# Patient Record
Sex: Female | Born: 1965 | Race: White | Hispanic: No | Marital: Married | Smoking: Never smoker
Health system: Southern US, Community
[De-identification: ages and names within clinical notes are randomized; demographics above are authoritative.]

## PROBLEM LIST (undated history)

## (undated) HISTORY — PX: ABDOMINAL HYSTERECTOMY: SHX81

---

## 2014-01-06 ENCOUNTER — Emergency Department: Payer: Self-pay | Admitting: Emergency Medicine

## 2014-01-06 LAB — ED INFLUENZA
H1N1 flu by pcr: NOT DETECTED
Influenza A By PCR: NEGATIVE
Influenza B By PCR: NEGATIVE

## 2014-01-07 ENCOUNTER — Emergency Department: Payer: Self-pay | Admitting: Emergency Medicine

## 2014-01-09 LAB — BETA STREP CULTURE(ARMC)

## 2015-02-24 ENCOUNTER — Encounter: Payer: Self-pay | Admitting: Emergency Medicine

## 2015-02-24 ENCOUNTER — Emergency Department: Payer: Self-pay

## 2015-02-24 ENCOUNTER — Emergency Department
Admission: EM | Admit: 2015-02-24 | Discharge: 2015-02-24 | Disposition: A | Discharge status: Home / Self Care | Payer: Self-pay | Attending: Student | Admitting: Student

## 2015-02-24 DIAGNOSIS — R55 Syncope and collapse: Secondary | ICD-10-CM | POA: Insufficient documentation

## 2015-02-24 DIAGNOSIS — J069 Acute upper respiratory infection, unspecified: Secondary | ICD-10-CM | POA: Insufficient documentation

## 2015-02-24 LAB — URINALYSIS COMPLETE WITH MICROSCOPIC (ARMC ONLY)
Bacteria, UA: NONE SEEN
Bilirubin Urine: NEGATIVE
Glucose, UA: NEGATIVE mg/dL
Leukocytes, UA: NEGATIVE
Nitrite: NEGATIVE
Protein, ur: 30 mg/dL — AB
Specific Gravity, Urine: 1.043 — ABNORMAL HIGH (ref 1.005–1.030)
pH: 5 (ref 5.0–8.0)

## 2015-02-24 LAB — CBC
HCT: 41.4 % (ref 35.0–47.0)
Hemoglobin: 13.9 g/dL (ref 12.0–16.0)
MCH: 29.2 pg (ref 26.0–34.0)
MCHC: 33.5 g/dL (ref 32.0–36.0)
MCV: 87.1 fL (ref 80.0–100.0)
Platelets: 170 K/uL (ref 150–440)
RBC: 4.75 MIL/uL (ref 3.80–5.20)
RDW: 14 % (ref 11.5–14.5)
WBC: 4.4 K/uL (ref 3.6–11.0)

## 2015-02-24 LAB — BASIC METABOLIC PANEL
ANION GAP: 10 (ref 5–15)
BUN: 13 mg/dL (ref 6–20)
CALCIUM: 8.8 mg/dL — AB (ref 8.9–10.3)
CO2: 21 mmol/L — ABNORMAL LOW (ref 22–32)
Chloride: 109 mmol/L (ref 101–111)
Creatinine, Ser: 0.72 mg/dL (ref 0.44–1.00)
GFR calc Af Amer: >60 mL/min (ref >60)
GLUCOSE: 139 mg/dL — AB (ref 65–99)
Potassium: 3.8 mmol/L (ref 3.5–5.1)
SODIUM: 140 mmol/L (ref 135–145)

## 2015-02-24 MED ORDER — SODIUM CHLORIDE 0.9 % IV BOLUS (SEPSIS)
1000.0000 mL | Freq: Once | INTRAVENOUS | Status: AC
  Administered 2015-02-24: 1000 mL via INTRAVENOUS

## 2015-02-24 MED ORDER — KETOROLAC TROMETHAMINE 30 MG/ML IJ SOLN
15.0000 mg | Freq: Once | INTRAMUSCULAR | Status: AC
  Administered 2015-02-24: 15 mg via INTRAVENOUS
  Filled 2015-02-24: qty 1

## 2015-02-24 NOTE — ED Notes (Signed)
Pt reports headache x3 days, reports syncopal episode/LOC last night, friend caught her, didn't hit her head.

## 2015-02-24 NOTE — ED Provider Notes (Signed)
[facility] Emergency Department Provider Note  ____________________________________________  Time seen: Approximately 9:55 AM  I have reviewed the triage vital signs and the nursing notes.   HISTORY  Chief Complaint Loss of Consciousness    HPI Dana Patton is a 50 y.o. female with no chronic medical problems who presents for evaluation of 2-3 days cough, sneezing, sore throat, headache, gradual onset, constant since onset, currently mild, no modifying factors. Patient reports that she has been around her friend's small child she was recently sick with upper respiratory tract infection symptoms. Patient also reports that yesterday, she stood up to walk to the bathroom, got lightheaded immediately, felt nauseated and flushed, told her friend that she was going to faint and then fainted. Her friend caught her. She did not hit her head or injure herself.  No chest pain. no shortness of breath. She denies fevers. No neck pain or neck stiffness.   History reviewed. No pertinent past medical history.  There are no active problems to display for this patient.   Past Surgical History  Procedure Laterality Date  . Abdominal hysterectomy      No current outpatient prescriptions on file.  Allergies Vicodin  No family history on file.  Social History Social History  Substance Use Topics  . Smoking status: Never Smoker   . Smokeless tobacco: None  . Alcohol Use: No    Review of Systems Constitutional: No fever/chills Eyes: No visual changes. ENT: + sore throat. Cardiovascular: Denies chest pain. Respiratory: Denies shortness of breath. Gastrointestinal: No abdominal pain.  No nausea, no vomiting.  No diarrhea.  No constipation. Genitourinary: Negative for dysuria. Musculoskeletal: Negative for back pain. Skin: Negative for rash. Neurological: Negative for headaches, focal weakness or numbness.  10-point ROS otherwise  negative.  ____________________________________________   PHYSICAL EXAM:  VITAL SIGNS: ED Triage Vitals  Enc Vitals Group     BP 02/24/15 0950 120/78 mmHg     Pulse Rate 02/24/15 0950 111     Resp 02/24/15 0950 20     Temp 02/24/15 0950 98.2 F (36.8 C)     Temp Source 02/24/15 0950 Oral     SpO2 02/24/15 0950 99 %     Weight 02/24/15 0950 160 lb (72.576 kg)     Height 02/24/15 0950  (1.676 m)     Head Cir --      Peak Flow --      Pain Score 02/24/15 0951 8     Pain Loc --      Pain Edu? --      Excl. in GC? --     Constitutional: Alert and oriented. Well appearing and in no acute distress. Eyes: Conjunctivae are normal. PERRL. EOMI. Head: Atraumatic. Nose: No congestion/rhinnorhea. Mouth/Throat: Mucous membranes are moist.  Oropharynx non-erythematous. Neck: No stridor. No cervical spine tenderness to palpation. Supple without meningismus. Cardiovascular: Normal rate, regular rhythm. Grossly normal heart sounds.  Good peripheral circulation. Respiratory: Normal respiratory effort.  No retractions. Lungs CTAB. Gastrointestinal: Soft and nontender. No distention. No CVA tenderness. Genitourinary: deferred Musculoskeletal: No lower extremity tenderness nor edema.  No joint effusions. Neurologic:  Normal speech and language. No gross focal neurologic deficits are appreciated.  Skin:  Skin is warm, dry and intact. No rash noted. Psychiatric: Mood and affect are normal. Speech and behavior are normal.  ____________________________________________   LABS (all labs ordered are listed, but only abnormal results are displayed)  Labs Reviewed  BASIC METABOLIC PANEL - Abnormal; Notable  for the following:    CO2 21 (*)    Glucose, Bld 139 (*)    Calcium 8.8 (*)    All other components within normal limits  URINALYSIS COMPLETEWITH MICROSCOPIC ([facility] ONLY) - Abnormal; Notable for the following:    Color, Urine AMBER (*)    APPearance CLEAR (*)    Ketones, ur TRACE (*)     Specific Gravity, Urine 1.043 (*)    Hgb urine dipstick 2+ (*)    Protein, ur 30 (*)    Squamous Epithelial / LPF 6-30 (*)    All other components within normal limits  CBC   ____________________________________________  EKG  ED ECG REPORT I, Gayla Doss, the attending physician, personally viewed and interpreted this ECG.   Date: 02/24/2015  EKG Time: 09:52  Rate: 108  Rhythm: sinus tachycardia  Axis: right  Intervals:none  ST&T Change: No acute ST elevation  ____________________________________________  RADIOLOGY  CXR FINDINGS: Lungs are clear. Heart size and pulmonary vascularity are normal. No adenopathy. No bone lesions.  IMPRESSION: No edema or consolidation. ____________________________________________   PROCEDURES  Procedure(s) performed: None  Critical Care performed: No  ____________________________________________   INITIAL IMPRESSION / ASSESSMENT AND PLAN / ED COURSE  Pertinent labs & imaging results that were available during my care of the patient were reviewed by me and considered in my medical decision making (see chart for details).  KHALEA Patton is a 50 y.o. female with no chronic medical problems who presents for evaluation of 2-3 days cough, sneezing, sore throat, headache. On exam, she is very well-appearing and in no acute distress. Talkative, pleasant, laughing. She is afebrile. She was initially tachycardic during her triage vitals however this has resolved at the time of my assessment without any ER intervention. Her exam is benign. She has an intact neurological exam. Suspect viral illness with mild dehydration that may have caused her to have vasovagal syncope yesterday. We'll give IV fluids, obtain screening labs. EKG not consistent with ischemia. Doubt cardiogenic or purely neurogenic cause of syncope.  ----------------------------------------- 1:22 PM on 02/24/2015 ----------------------------------------- Patient is  resting comfortably. Symptoms improved after IV fluids. Chest x-ray clear. CBC and BMP unremarkable. I discussed with her that her urinalysis has several red blood cells, few white blood cells but appears contaminated with multiple squamous cells. She has no dysuria or urinary complaints at this time. We'll send culture. I discussed with her that she needs to follow-up with a primary care doctor in 1 week for reevaluation of microhematuria. We discussed return precautions, need for close follow-up and she is comfortable with the discharge plan. DC home.  ____________________________________________   FINAL CLINICAL IMPRESSION(S) / ED DIAGNOSES  Final diagnoses:  URI (upper respiratory infection)  Syncope, unspecified syncope type      Gayla Doss, MD 02/24/15 1323

## 2015-02-24 NOTE — ED Notes (Signed)
Patient transported to X-ray.  Pt will attempt UA after receiving some fluids. Reports just went

## 2015-03-03 ENCOUNTER — Emergency Department
Admission: EM | Admit: 2015-03-03 | Discharge: 2015-03-03 | Disposition: A | Discharge status: Home / Self Care | Payer: Self-pay | Attending: Emergency Medicine | Admitting: Emergency Medicine

## 2015-03-03 ENCOUNTER — Encounter: Payer: Self-pay | Admitting: Emergency Medicine

## 2015-03-03 DIAGNOSIS — H938X3 Other specified disorders of ear, bilateral: Secondary | ICD-10-CM | POA: Insufficient documentation

## 2015-03-03 DIAGNOSIS — J209 Acute bronchitis, unspecified: Secondary | ICD-10-CM | POA: Insufficient documentation

## 2015-03-03 DIAGNOSIS — R110 Nausea: Secondary | ICD-10-CM | POA: Insufficient documentation

## 2015-03-03 MED ORDER — ALBUTEROL SULFATE HFA 108 (90 BASE) MCG/ACT IN AERS: 1.0000 | INHALATION_SPRAY | Freq: Four times a day (QID) | RESPIRATORY_TRACT | Status: AC | PRN

## 2015-03-03 MED ORDER — BENZONATATE 100 MG PO CAPS: 200.0000 mg | ORAL_CAPSULE | Freq: Three times a day (TID) | ORAL | Status: AC | PRN

## 2015-03-03 MED ORDER — AZITHROMYCIN 250 MG PO TABS: ORAL_TABLET | ORAL | Status: AC

## 2015-03-03 NOTE — ED Provider Notes (Signed)
[facility] Emergency Department Provider Note  ____________________________________________  Time seen: Approximately 2:36 PM  I have reviewed the triage vital signs and the nursing notes.   HISTORY  Chief Complaint Cough and Headache    HPI Dana Patton is a 50 y.o. female , NAD, presents to the emergency department with 10 day history of dry cough. Was seen in this emergency department approximately one week ago with similar symptoms, diagnosed with viral upper respiratory infection and asked to follow up with primary care provider. Unfortunately she does not have insurance and cannot afford to seek care outside of the emergency department. She presents here again today as cough is not improved. States the cough is dry and constant. Can be productive of sputum at times. She cough so much that it causes her to feel dizzy and nauseous. Denies any fever, chills, body aches.   History reviewed. No pertinent past medical history.  There are no active problems to display for this patient.   Past Surgical History  Procedure Laterality Date  . Abdominal hysterectomy      Current Outpatient Rx  Name  Route  Sig  Dispense  Refill  . albuterol (PROVENTIL HFA;VENTOLIN HFA) 108 (90 Base) MCG/ACT inhaler   Inhalation   Inhale 1-2 puffs into the lungs every 6 (six) hours as needed for wheezing or shortness of breath.   1 Inhaler   0   . azithromycin (ZITHROMAX Z-PAK) 250 MG tablet      Take 2 tablets (500 mg) on  Day 1,  followed by 1 tablet (250 mg) once daily on Days 2 through 5.   6 each   0   . benzonatate (TESSALON PERLES) 100 MG capsule   Oral   Take 2 capsules (200 mg total) by mouth 3 (three) times daily as needed for cough.   30 capsule   0     Allergies Vicodin  No family history on file.  Social History Social History  Substance Use Topics  . Smoking status: Never Smoker   . Smokeless tobacco: None  . Alcohol Use: No      Review of Systems Constitutional: No fever/chills Eyes: No visual changes. No discharge ENT: No sore throat, ear pain, sinus pain. Cardiovascular: No chest pain. Respiratory: Positive cough and chest congestion. Positive shortness of breath with coughing fits. No wheezing.  Gastrointestinal: Additional nausea with coughing fits. No abdominal pain, vomiting.   Musculoskeletal: Negative for allergies.  Skin: Negative for rash. Neurological: Positive for headaches with cough, but no focal weakness or numbness. 10-point ROS otherwise negative.  ____________________________________________   PHYSICAL EXAM:  VITAL SIGNS: ED Triage Vitals  Enc Vitals Group     BP 03/03/15 1336 101/85 mmHg     Pulse Rate 03/03/15 1336 92     Resp 03/03/15 1336 20     Temp 03/03/15 1336 98 F (36.7 C)     Temp Source 03/03/15 1336 Oral     SpO2 03/03/15 1336 97 %     Weight 03/03/15 1336 160 lb (72.576 kg)     Height 03/03/15 1336  (1.676 m)     Head Cir --      Peak Flow --      Pain Score 03/03/15 1346 8     Pain Loc --      Pain Edu? --      Excl. in GC? --     Constitutional: Alert and oriented. Well appearing and in no acute  distress. Eyes: Conjunctivae are normal. PERRL. EOMI without pain.  Head: Atraumatic. ENT:      Ears: Lateral TMs visualized without perforation, erythema, bulging with normal light reflex. Mild serous effusion bilaterally.      Nose: No congestion/rhinnorhea.      Mouth/Throat: Mucous membranes are moist. Pharynx without erythema, swelling, exudate Neck: No stridor. Supple with full range of motion Hematological/Lymphatic/Immunilogical: No cervical lymphadenopathy. Cardiovascular: Normal rate, regular rhythm. Normal S1 and S2.   Respiratory: Normal respiratory effort without tachypnea or retractions. Lungs CTAB. Coughing through visit. Neurologic:  Normal speech and language. No gross focal neurologic deficits are appreciated.  Skin:  Skin is warm, dry  and intact. No rash noted. Psychiatric: Mood and affect are normal. Speech and behavior are normal. Patient exhibits appropriate insight and judgement.   ____________________________________________   LABS  None  ____________________________________________  EKG  None ____________________________________________  RADIOLOGY  None ____________________________________________    PROCEDURES  Procedure(s) performed: None   Medications - No data to display   ____________________________________________   INITIAL IMPRESSION / ASSESSMENT AND PLAN / ED COURSE  Patient's diagnosis is consistent with acute bronchitis. Patient will be discharged home with prescriptions for Zithromax to take as directed, Tessalon Perles to decrease cyclical cough, albuterol inhaler to use 1-2 puffs as needed every 6 hours for shortness of breath. Patient is to follow up with [facility] if symptoms persist past this treatment course. Patient is given ED precautions to return to the ED for any worsening or new symptoms.    ____________________________________________  FINAL CLINICAL IMPRESSION(S) / ED DIAGNOSES  Final diagnoses:  Acute bronchitis, unspecified organism      NEW MEDICATIONS STARTED DURING THIS VISIT:  New Prescriptions   ALBUTEROL (PROVENTIL HFA;VENTOLIN HFA) 108 (90 BASE) MCG/ACT INHALER    Inhale 1-2 puffs into the lungs every 6 (six) hours as needed for wheezing or shortness of breath.   AZITHROMYCIN (ZITHROMAX Z-PAK) 250 MG TABLET    Take 2 tablets (500 mg) on  Day 1,  followed by 1 tablet (250 mg) once daily on Days 2 through 5.   BENZONATATE (TESSALON PERLES) 100 MG CAPSULE    Take 2 capsules (200 mg total) by mouth 3 (three) times daily as needed for cough.         Hope Pigeon, PA-C 03/03/15 1448  Phineas Semen, MD 03/03/15 [phone]

## 2015-03-03 NOTE — Discharge Instructions (Signed)

## 2015-03-03 NOTE — ED Notes (Signed)
Pt here for cough and headache, was seen here last Thursday for the same. States she needs something for her cough.

## 2015-03-03 NOTE — ED Notes (Signed)
States she was seen for cough last week   conts to have cough but now has some headache  Dry cough

## 2015-03-05 ENCOUNTER — Encounter: Payer: Self-pay | Admitting: Emergency Medicine

## 2015-03-05 ENCOUNTER — Emergency Department
Admission: EM | Admit: 2015-03-05 | Discharge: 2015-03-05 | Disposition: A | Discharge status: Home / Self Care | Payer: Self-pay | Attending: Emergency Medicine | Admitting: Emergency Medicine

## 2015-03-05 ENCOUNTER — Emergency Department: Payer: Self-pay

## 2015-03-05 DIAGNOSIS — Z792 Long term (current) use of antibiotics: Secondary | ICD-10-CM | POA: Insufficient documentation

## 2015-03-05 DIAGNOSIS — J40 Bronchitis, not specified as acute or chronic: Secondary | ICD-10-CM | POA: Insufficient documentation

## 2015-03-05 LAB — COMPREHENSIVE METABOLIC PANEL
ALBUMIN: 3.9 g/dL (ref 3.5–5.0)
ALT: 58 U/L — ABNORMAL HIGH (ref 14–54)
ANION GAP: 4 — AB (ref 5–15)
AST: 40 U/L (ref 15–41)
Alkaline Phosphatase: 142 U/L — ABNORMAL HIGH (ref 38–126)
BILIRUBIN TOTAL: 0.4 mg/dL (ref 0.3–1.2)
BUN: 15 mg/dL (ref 6–20)
CHLORIDE: 106 mmol/L (ref 101–111)
CO2: 29 mmol/L (ref 22–32)
Calcium: 9.1 mg/dL (ref 8.9–10.3)
Creatinine, Ser: 0.57 mg/dL (ref 0.44–1.00)
GFR calc Af Amer: >60 mL/min (ref >60)
Glucose, Bld: 98 mg/dL (ref 65–99)
POTASSIUM: 4.1 mmol/L (ref 3.5–5.1)
Sodium: 139 mmol/L (ref 135–145)
TOTAL PROTEIN: 7.6 g/dL (ref 6.5–8.1)

## 2015-03-05 LAB — CBC WITH DIFFERENTIAL/PLATELET
BASOS ABS: 0.1 10*3/uL (ref 0–0.1)
BASOS PCT: 1 %
Eosinophils Absolute: 0.2 10*3/uL (ref 0–0.7)
Eosinophils Relative: 2 %
HEMATOCRIT: 42 % (ref 35.0–47.0)
Hemoglobin: 14.3 g/dL (ref 12.0–16.0)
LYMPHS ABS: 2.6 10*3/uL (ref 1.0–3.6)
LYMPHS PCT: 24 %
MCH: 29.4 pg (ref 26.0–34.0)
MCHC: 34 g/dL (ref 32.0–36.0)
MCV: 86.4 fL (ref 80.0–100.0)
MONO ABS: 0.7 10*3/uL (ref 0.2–0.9)
Monocytes Relative: 7 %
NEUTROS ABS: 7.1 10*3/uL — AB (ref 1.4–6.5)
Neutrophils Relative %: 66 %
PLATELETS: 320 10*3/uL (ref 150–440)
RBC: 4.86 MIL/uL (ref 3.80–5.20)
RDW: 13.8 % (ref 11.5–14.5)
WBC: 10.7 10*3/uL (ref 3.6–11.0)

## 2015-03-05 MED ORDER — ALBUTEROL SULFATE (2.5 MG/3ML) 0.083% IN NEBU
2.5000 mg | INHALATION_SOLUTION | Freq: Once | RESPIRATORY_TRACT | Status: AC
  Administered 2015-03-05: 2.5 mg via RESPIRATORY_TRACT
  Filled 2015-03-05: qty 3

## 2015-03-05 NOTE — ED Notes (Signed)
Pt c/o dry hacking cough for the last two weeks - Non-productive per pt - Also c/o headache

## 2015-03-05 NOTE — Discharge Instructions (Signed)
Please call the [facility] to establish care.   May also consider [facility] or the [facility].

## 2015-03-05 NOTE — ED Provider Notes (Signed)
[facility] Emergency Department Provider Note  ____________________________________________  Time seen: Approximately 3:22 PM  I have reviewed the triage vital signs and the nursing notes.   HISTORY  Chief Complaint Cough    HPI AUGUSTINE BRANNICK is a 50 y.o. female , NAD, presents to the emergency department with continued dry hacking cough for the last 2 weeks. Patient was seen 2 days ago and given a Z-Pak, benzonatate tablets and an albuterol inhaler. Notes she continues with a tickle in her throat and cyclical coughing. Has not improved. Continues with headache with cough. Denies LOC, dizziness, weakness, numbness, tingling. Denies chest congestion and wheezing. No chest pain. Continues to deny fever, chills, body aches.   History reviewed. No pertinent past medical history.  There are no active problems to display for this patient.   Past Surgical History  Procedure Laterality Date  . Abdominal hysterectomy      Current Outpatient Rx  Name  Route  Sig  Dispense  Refill  . albuterol (PROVENTIL HFA;VENTOLIN HFA) 108 (90 Base) MCG/ACT inhaler   Inhalation   Inhale 1-2 puffs into the lungs every 6 (six) hours as needed for wheezing or shortness of breath.   1 Inhaler   0   . azithromycin (ZITHROMAX Z-PAK) 250 MG tablet      Take 2 tablets (500 mg) on  Day 1,  followed by 1 tablet (250 mg) once daily on Days 2 through 5.   6 each   0   . benzonatate (TESSALON PERLES) 100 MG capsule   Oral   Take 2 capsules (200 mg total) by mouth 3 (three) times daily as needed for cough.   30 capsule   0     Allergies Vicodin  No family history on file.  Social History Social History  Substance Use Topics  . Smoking status: Never Smoker   . Smokeless tobacco: None  . Alcohol Use: Yes     Review of Systems Constitutional: No fever/chills Eyes: No visual changes. No discharge ENT: Positive tickle in the throat. No sore throat, ear pain, nasal  congestion. Cardiovascular: No chest pain. Respiratory: Positive dry, nonproductive cough. No shortness of breath. No wheezing.  Gastrointestinal: No abdominal pain.  No nausea, vomiting.  No diarrhea, constipation. Musculoskeletal: Negative for general myalgias.  Skin: Negative for rash. Neurological: Positive for headaches as cough only, but no focal weakness or numbness. 10-point ROS otherwise negative.  ____________________________________________   PHYSICAL EXAM:  VITAL SIGNS: ED Triage Vitals  Enc Vitals Group     BP 03/05/15 1403 120/81 mmHg     Pulse Rate 03/05/15 1403 97     Resp 03/05/15 1403 20     Temp 03/05/15 1403 99 F (37.2 C)     Temp Source 03/05/15 1403 Oral     SpO2 03/05/15 1403 100 %     Weight 03/05/15 1403 160 lb (72.576 kg)     Height 03/05/15 1403  (1.676 m)     Head Cir --      Peak Flow --      Pain Score 03/05/15 1404 10     Pain Loc --      Pain Edu? --      Excl. in GC? --     Constitutional: Alert and oriented. Well appearing and in no acute distress. Eyes: Conjunctivae are normal. PERRL. EOMI without pain.  Head: Atraumatic. Neck: Supple with full range of motion. No stridor. Cardiovascular: Normal rate, regular rhythm. Normal  S1 and S2.  Good peripheral circulation. Respiratory: Increased respiratory rate visually but no increased respiratory effort nor retractions. Lungs CTAB. Neurologic:  Normal speech and language. No gross focal neurologic deficits are appreciated.  Skin:  Skin is warm, dry and intact. No rash noted. Psychiatric: Mood and affect are normal. Speech and behavior are normal. Patient exhibits appropriate insight and judgement.   ____________________________________________   LABS (all labs ordered are listed, but only abnormal results are displayed)  Labs Reviewed  COMPREHENSIVE METABOLIC PANEL - Abnormal; Notable for the following:    ALT 58 (*)    Alkaline Phosphatase 142 (*)    Anion gap 4 (*)    All  other components within normal limits  CBC WITH DIFFERENTIAL/PLATELET - Abnormal; Notable for the following:    Neutro Abs 7.1 (*)    All other components within normal limits   ____________________________________________  EKG  None ____________________________________________  RADIOLOGY I have personally viewed and evaluated these images (plain radiographs) as part of my medical decision making, as well as reviewing the written report by the radiologist.  Dg Chest 2 View  03/05/2015  CLINICAL DATA:  Cough for 2 weeks.  Subsequent encounter. EXAM: CHEST  2 VIEW COMPARISON:  PA and lateral chest 02/24/2015. FINDINGS: The lungs are clear. Heart size is normal. No pneumothorax or pleural effusion. No focal bony abnormality. IMPRESSION: Negative chest. Electronically Signed   By: Drusilla Kanner M.D.   On: 03/05/2015 15:52    ____________________________________________    PROCEDURES  Procedure(s) performed: None    Medications  albuterol (PROVENTIL) (2.5 MG/3ML) 0.083% nebulizer solution 2.5 mg (2.5 mg Nebulization Given 03/05/15 1617)     ____________________________________________   INITIAL IMPRESSION / ASSESSMENT AND PLAN / ED COURSE  Prior to administration of albuterol nebulizer solution, I entered the exam room to discuss such with the patient. She was playing in the exam bed l asked and playing on her cell phone. Respiratory rate was normal without any increased effort or visualize shortness of breath. When the patient made eye contact with me, her respiratory rate increased. Discussed administration of albuterol nebulized solution and patient agreed. After albuterol was administered, the patient notes she had no more cough in improved respiration. Patient again was monitored by myself and noted to have no abnormal respiratory rate without increased effort of breath and a normal respiratory exam.   Pertinent labs & imaging results that were available during my care of  the patient were reviewed by me and considered in my medical decision making (see chart for details).  Patient's diagnosis is consistent with acute bronchitis. As x-ray was negative and CBC and CMP without any significant abnormalities. Patient will be discharged home with instructions to continue previous medications as prescribed. Patient is to follow up with the [facility] community clinic if symptoms persist past this treatment course. Patient is given ED precautions to return to the ED for any worsening or new symptoms.   ____________________________________________  FINAL CLINICAL IMPRESSION(S) / ED DIAGNOSES  Final diagnoses:  Bronchitis      NEW MEDICATIONS STARTED DURING THIS VISIT:  New Prescriptions   No medications on file         Hope Pigeon, PA-C 03/05/15 1651  Emily Filbert, MD 03/09/15 1040

## 2015-03-05 NOTE — ED Notes (Signed)
Cough x 2 weeks, continual dry cough.

## 2015-12-15 DIAGNOSIS — Y999 Unspecified external cause status: Secondary | ICD-10-CM | POA: Insufficient documentation

## 2015-12-15 DIAGNOSIS — Y939 Activity, unspecified: Secondary | ICD-10-CM | POA: Insufficient documentation

## 2015-12-15 DIAGNOSIS — S60051A Contusion of right little finger without damage to nail, initial encounter: Secondary | ICD-10-CM | POA: Insufficient documentation

## 2015-12-15 DIAGNOSIS — W231XXA Caught, crushed, jammed, or pinched between stationary objects, initial encounter: Secondary | ICD-10-CM | POA: Insufficient documentation

## 2015-12-15 DIAGNOSIS — Y929 Unspecified place or not applicable: Secondary | ICD-10-CM | POA: Insufficient documentation

## 2015-12-15 DIAGNOSIS — Z79899 Other long term (current) drug therapy: Secondary | ICD-10-CM | POA: Insufficient documentation

## 2015-12-16 ENCOUNTER — Emergency Department (HOSPITAL_COMMUNITY)
Admission: EM | Admit: 2015-12-16 | Discharge: 2015-12-16 | Disposition: A | Discharge status: Home / Self Care | Payer: Self-pay | Attending: Emergency Medicine | Admitting: Emergency Medicine

## 2015-12-16 ENCOUNTER — Emergency Department (HOSPITAL_COMMUNITY): Payer: Self-pay

## 2015-12-16 ENCOUNTER — Encounter (HOSPITAL_COMMUNITY): Payer: Self-pay | Admitting: Emergency Medicine

## 2015-12-16 DIAGNOSIS — S6000XA Contusion of unspecified finger without damage to nail, initial encounter: Secondary | ICD-10-CM

## 2015-12-16 NOTE — Discharge Instructions (Signed)
Follow-up with your primary Dr. if not improving in the next week.

## 2015-12-16 NOTE — ED Triage Notes (Signed)
Pt states she crushed pinky finger between dresser and wall while moving furniture 2 days ago pain has worsen

## 2015-12-16 NOTE — ED Notes (Signed)
Pt refused last set of vitals.  Unable to get e-pad to work.  Verbally consented to discharge.

## 2015-12-16 NOTE — ED Provider Notes (Signed)
[facility]-EMERGENCY DEPT Provider Note   CSN: [id number] Arrival date & time: 12/15/15  2341  By signing my name below, I, Dana Patton, attest that this documentation has been prepared under the direction and in the presence of Dana Patton Jesusa Stenerson, MD. Electronically Signed: Soijett Patton, ED Scribe. 12/16/15. 1:41 AM.  History   Chief Complaint Chief Complaint  Patient presents with  . Finger Injury    HPI Dana Patton is a 50 y.o. female who presents to the Emergency Department complaining of right pinky finger injury occurring 2 days ago. Pt notes that she was attempting to move furniture when her right pinky finger was crushed between a dresser and the wall. Pt denies being evaluated following the onset of her symptoms. Pt states that her right pinky finger pain has increased since the initial injury. Pt is having associated symptoms of right pinky finger pain and right pinky finger swelling. She notes that she has not tried any medications for the relief of her symptoms. She denies color change, wound, and any other symptoms.    The history is provided by the patient. No language interpreter was used.    History reviewed. No pertinent past medical history.  There are no active problems to display for this patient.   Past Surgical History:  Procedure Laterality Date  . ABDOMINAL HYSTERECTOMY      OB History    No data available       Home Medications    Prior to Admission medications   Medication Sig Start Date End Date Taking? Authorizing Provider  albuterol (PROVENTIL HFA;VENTOLIN HFA) 108 (90 Base) MCG/ACT inhaler Inhale 1-2 puffs into the lungs every 6 (six) hours as needed for wheezing or shortness of breath. 03/03/15   Jami L Hagler, PA-C  azithromycin (ZITHROMAX Z-PAK) 250 MG tablet Take 2 tablets (500 mg) on  Day 1,  followed by 1 tablet (250 mg) once daily on Days 2 through 5. 03/03/15   Jami L Hagler, PA-C  benzonatate (TESSALON PERLES) 100 MG capsule Take 2 capsules  (200 mg total) by mouth 3 (three) times daily as needed for cough. 03/03/15   Jami L Hagler, PA-C    Family History History reviewed. No pertinent family history.  Social History Social History  Substance Use Topics  . Smoking status: Never Smoker  . Smokeless tobacco: Never Used  . Alcohol use Yes     Allergies   Vicodin [hydrocodone-acetaminophen]   Review of Systems Review of Systems  All other systems reviewed and are negative.    Physical Exam Updated Vital Signs BP 145/85 (BP Location: Left Arm)   Pulse 100   Temp 97.9 F (36.6 C) (Oral)   Resp 15   Ht 5\' 6"  (1.676 m)   Wt 140 lb (63.5 kg)   SpO2 99%   BMI 22.60 kg/m   Physical Exam  Constitutional: She is oriented to person, place, and time. She appears well-developed and well-nourished. No distress.  HENT:  Head: Normocephalic and atraumatic.  Eyes: EOM are normal.  Neck: Neck supple.  Cardiovascular: Normal rate.   Pulmonary/Chest: Effort normal. No respiratory distress.  Abdominal: She exhibits no distension.  Musculoskeletal: Normal range of motion.       Right hand: She exhibits tenderness and swelling.  Swelling and TTP over the DIP joint of right fifth finger. Mild pain with ROM.   Neurological: She is alert and oriented to person, place, and time.  Skin: Skin is warm and dry.  Psychiatric: She has  a normal mood and affect. Her behavior is normal.  Nursing note and vitals reviewed.    ED Treatments / Results  DIAGNOSTIC STUDIES: Oxygen Saturation is 99% on RA, nl by my interpretation.    COORDINATION OF CARE: 1:39 AM Discussed treatment plan with pt at bedside which includes right little finger xray and pt agreed to plan.   Radiology Dg Finger Little Right  Result Date: 12/16/2015 CLINICAL DATA:  Crushed right fifth finger, with pain and swelling. Initial encounter. EXAM: RIGHT LITTLE FINGER 2+V COMPARISON:  None. FINDINGS: There is no evidence of acute fracture or dislocation.  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 Mild surrounding soft tissue swelling is noted. IMPRESSION: 1. No evidence of acute fracture or dislocation. 2. 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 Electronically Signed   By: Roanna RaiderJeffery  Chang M.D.   On: 12/16/2015 02:00    Procedures Procedures (including critical care time)  Medications Ordered in ED Medications - No data to display   Initial Impression / Assessment and Plan / ED Course  I have reviewed the triage vital signs and the nursing notes.  Pertinent imaging results that were available during my care of the patient were reviewed by me and considered in my medical decision making (see chart for details).  Clinical Course     X-rays negative for fracture. Will treat as a contusion. To return as needed for any problems.  Final Clinical Impressions(s) / ED Diagnoses   Final diagnoses:  None    New Prescriptions New Prescriptions   No medications on file   I personally performed the services described in this documentation, which was scribed in my presence. The recorded information has been reviewed and is accurate.        Dana Patton Dana Liese, MD 12/16/15 [phone]

## 2017-01-09 IMAGING — CR DG CHEST 2V
1 series · 2 of 2 positions shown · non-contrast
Comparison: None.

CLINICAL DATA: Cough for 3 days.  Shortness of breath for 2 days

EXAM:
CHEST  2 VIEW

[Series 1: dg chest 2 view · 0.14mm/px · 2 of 2 slices shown]
[im 1/2]
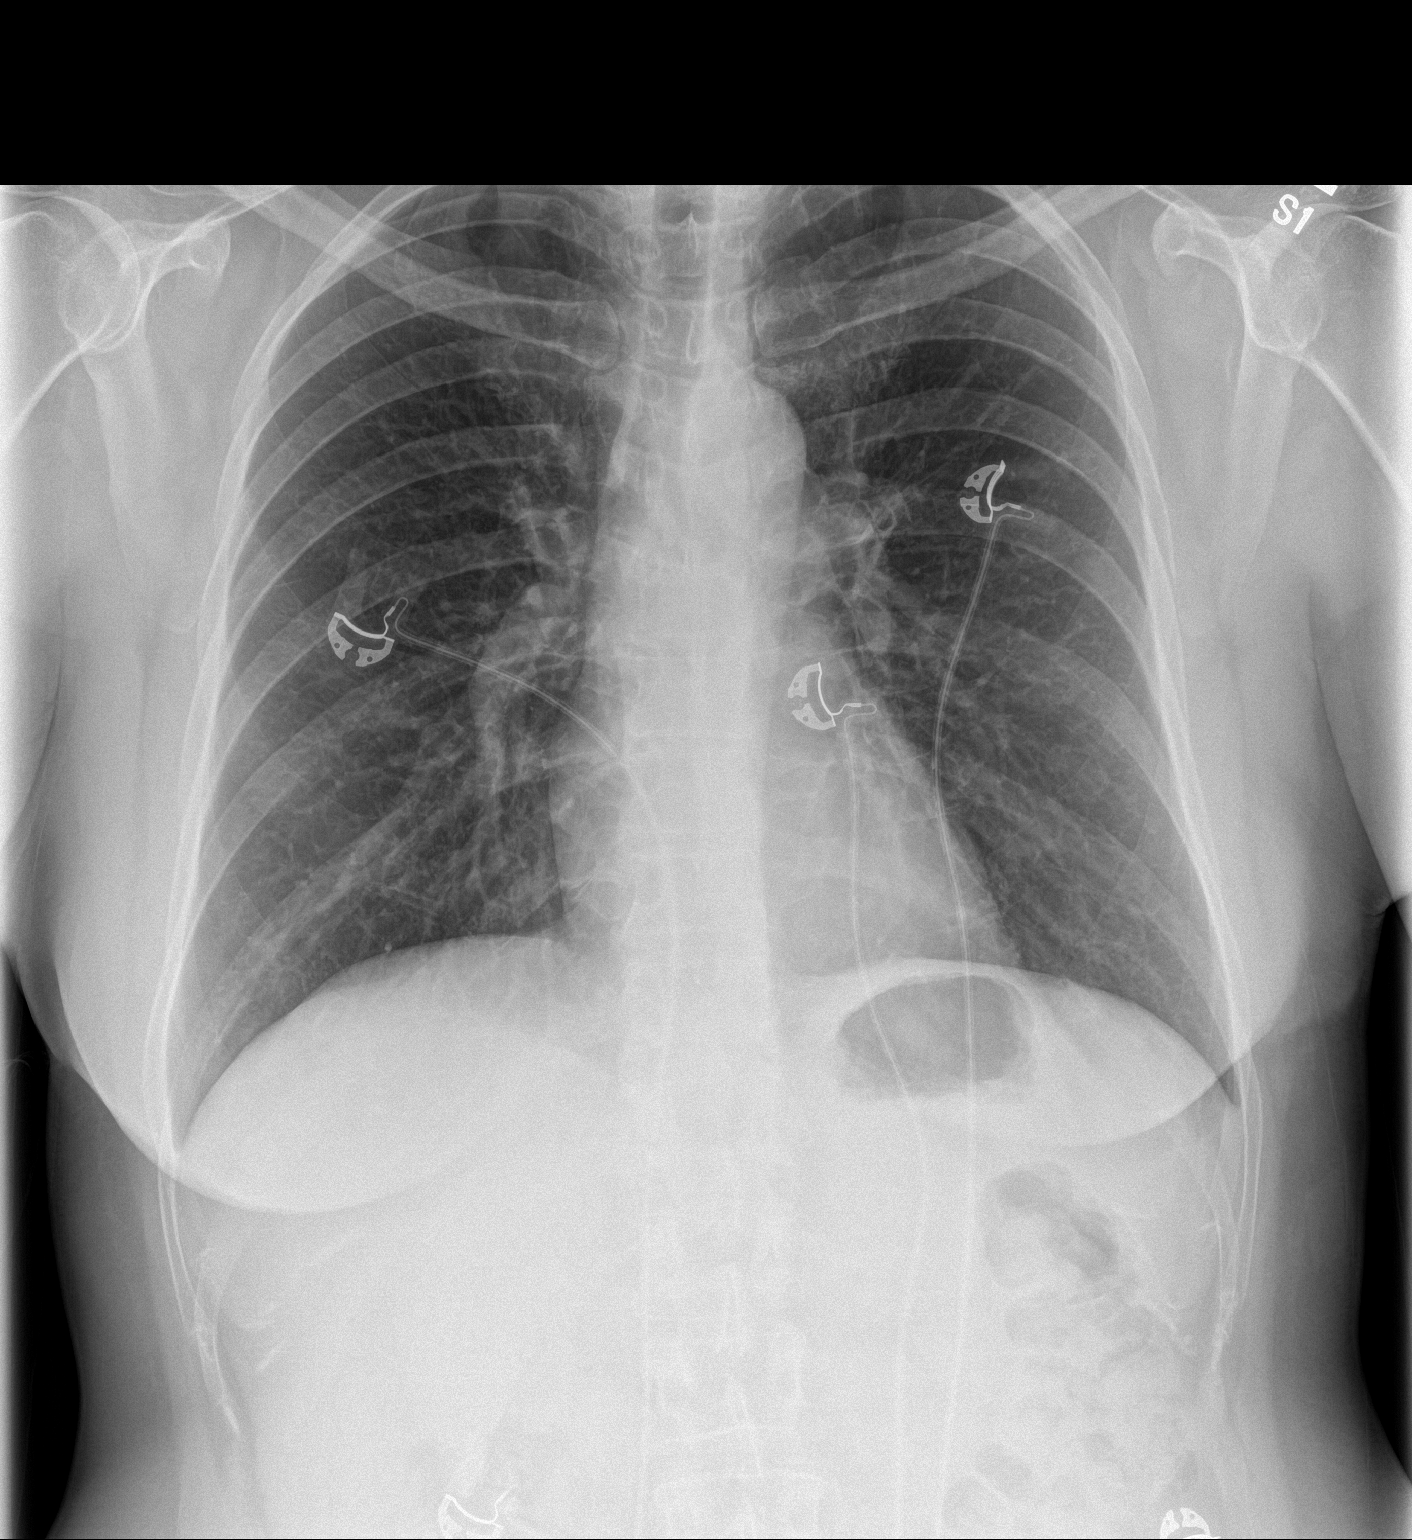
[im 2/2]
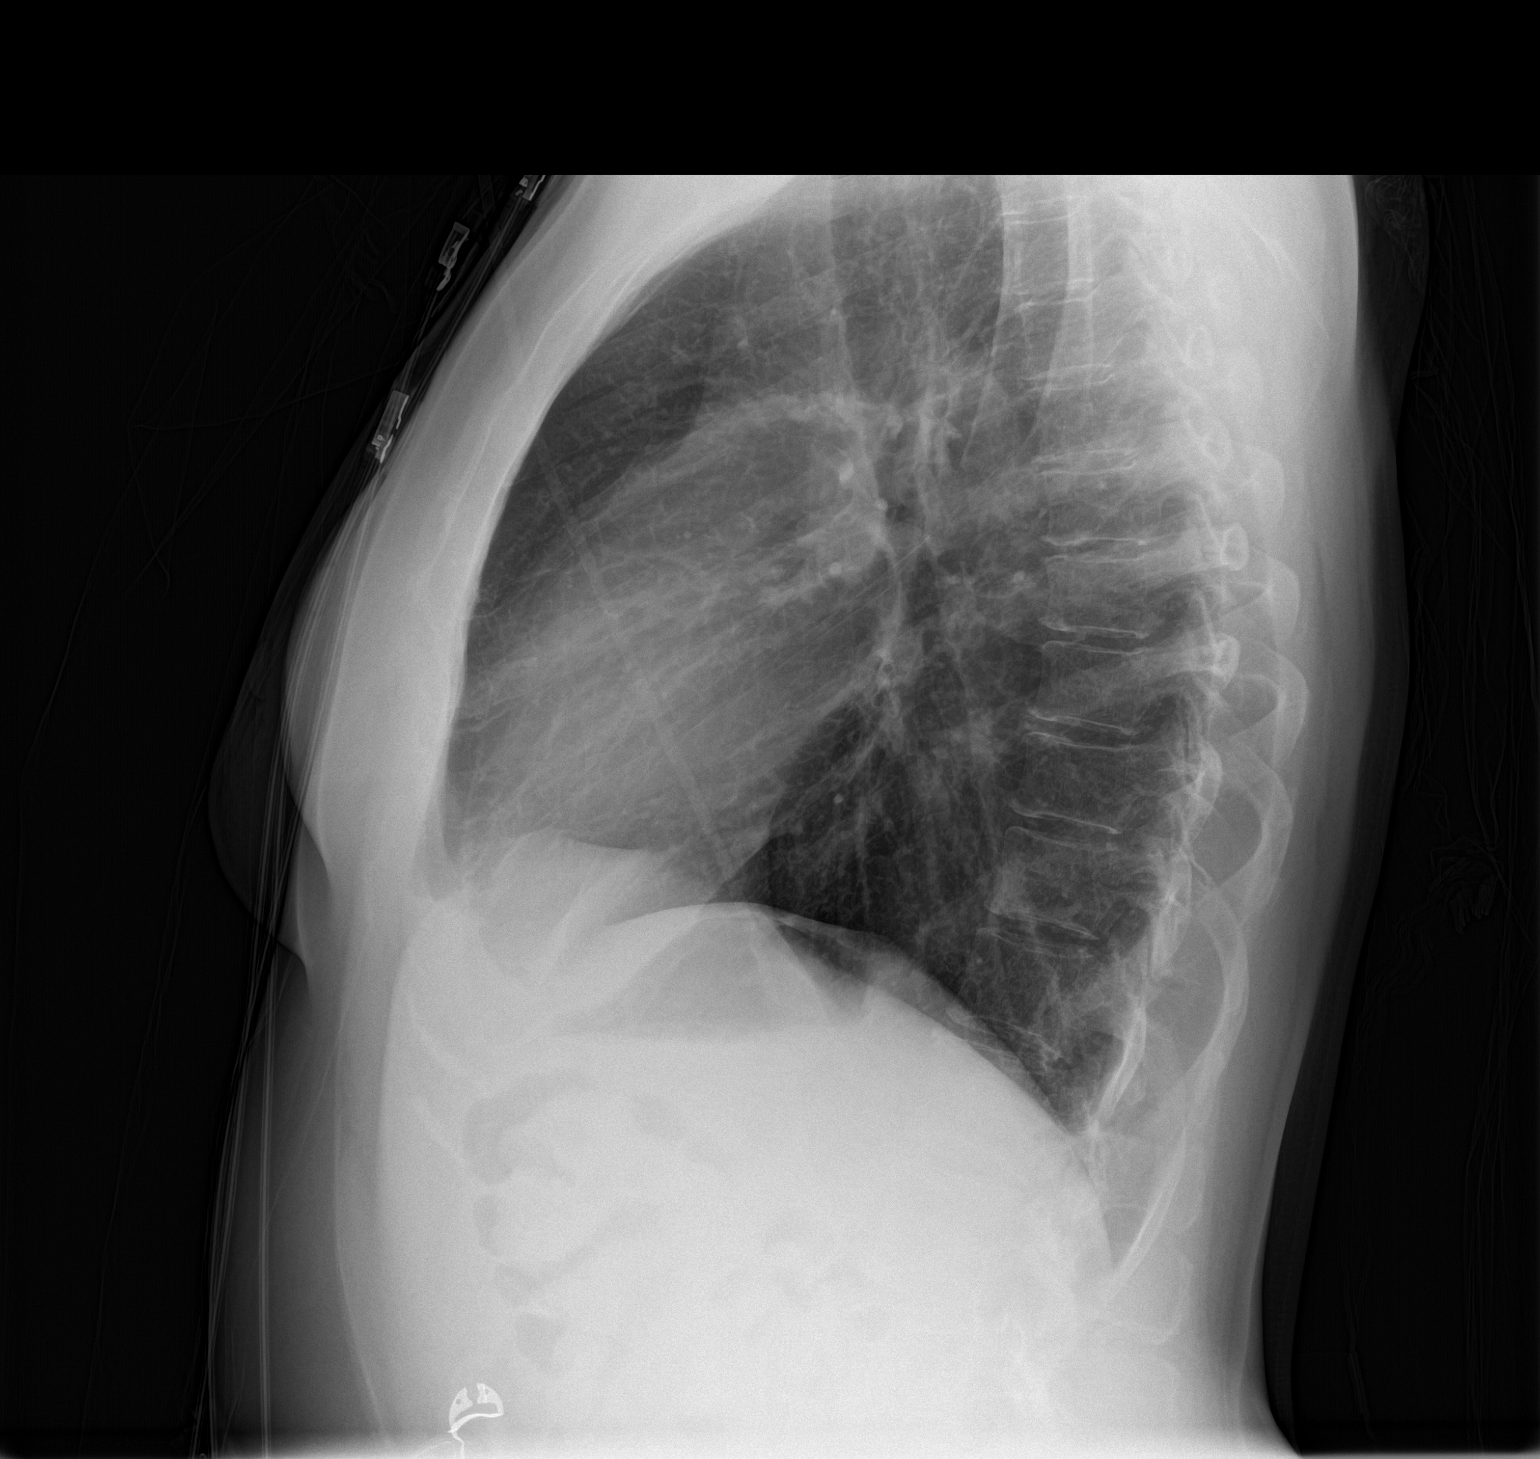

[2 of 2 positions shown; findings below may reference images not displayed]

FINDINGS: Lungs are clear. Heart size and pulmonary vascularity are normal. No
adenopathy. No bone lesions.
IMPRESSION: No edema or consolidation.

## 2017-10-31 IMAGING — CR DG FINGER LITTLE 2+V*R*
3 series · 3 of 3 positions shown · non-contrast
Comparison: None.

CLINICAL DATA: Crushed right fifth finger, with pain and swelling.
Initial encounter.

EXAM:
RIGHT LITTLE FINGER 2+V

[x finger pa right]
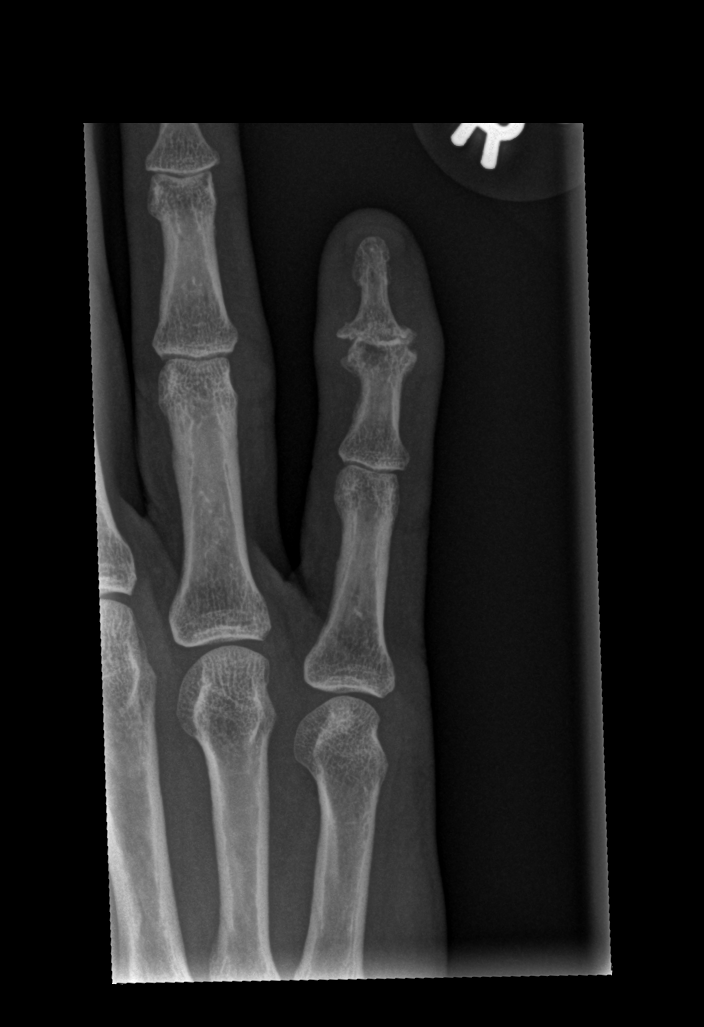

[x finger obl right]
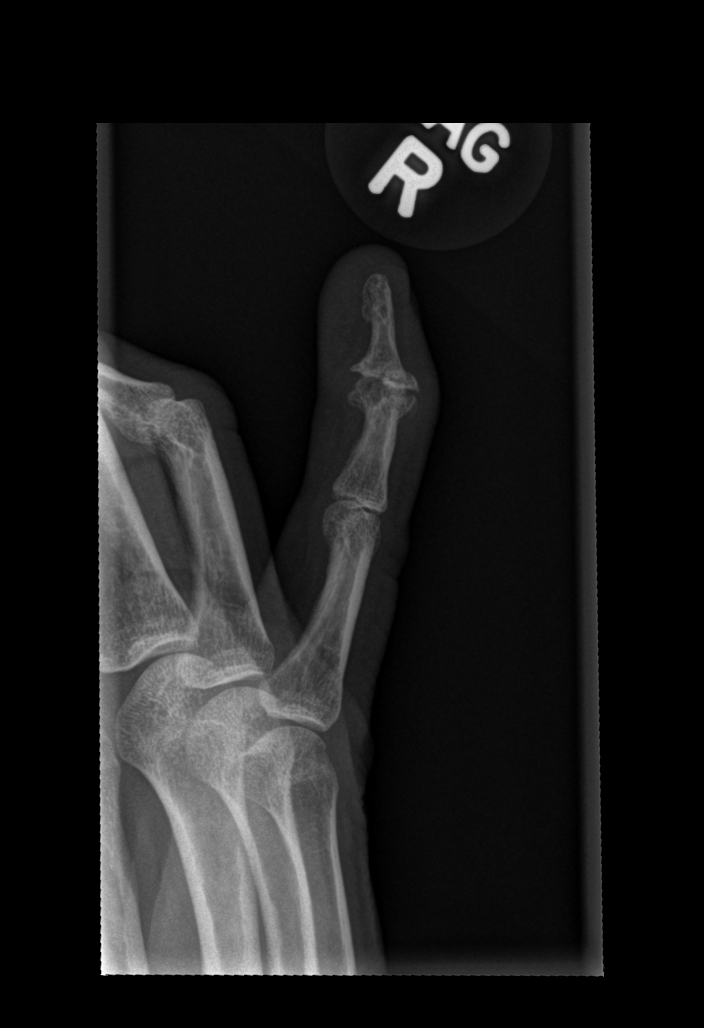

[x finger lat right]
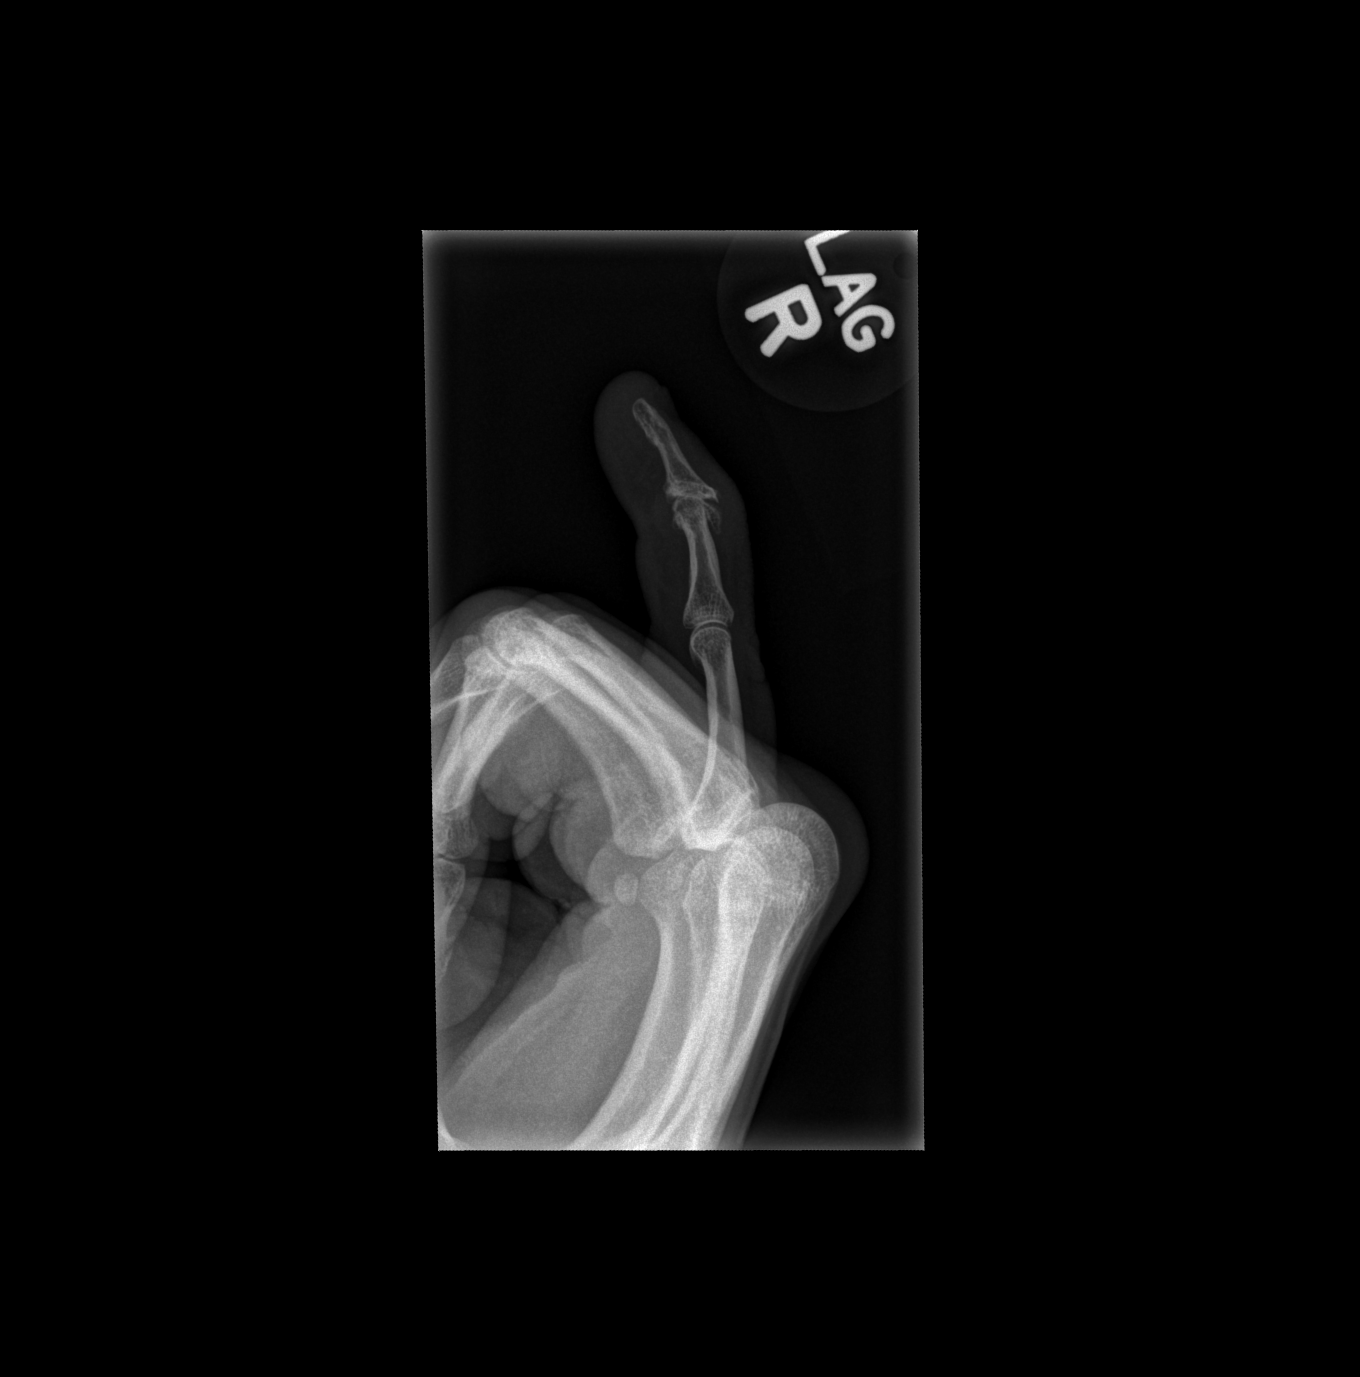

[3 of 3 positions shown; findings below may reference images not displayed]

FINDINGS: There is no evidence of acute fracture or dislocation.
Osteoarthritis is noted at the fifth distal interphalangeal joint,
with an underlying erosion. Mild surrounding soft tissue swelling is
noted.
IMPRESSION: 1. No evidence of acute fracture or dislocation.
2. Degenerative change at the fifth distal interphalangeal joint,
with underlying erosion, likely reflecting erosive osteoarthritis.
